# Patient Record
Sex: Female | Born: 1975 | Race: White | Hispanic: No | Marital: Single | Smoking: Never smoker
Health system: Southern US, Community
[De-identification: ages and names within clinical notes are randomized; demographics above are authoritative.]

## PROBLEM LIST (undated history)

## (undated) HISTORY — PX: AUGMENTATION MAMMAPLASTY: SUR837

## (undated) HISTORY — PX: BREAST EXCISIONAL BIOPSY: SUR124

## (undated) HISTORY — PX: BREAST CYST EXCISION: SHX579

---

## 2012-04-23 ENCOUNTER — Other Ambulatory Visit: Payer: Self-pay | Admitting: Infectious Diseases

## 2012-04-23 ENCOUNTER — Ambulatory Visit
Admission: RE | Admit: 2012-04-23 | Discharge: 2012-04-23 | Disposition: A | Discharge status: Home / Self Care | Payer: 59 | Source: Ambulatory Visit | Attending: Infectious Diseases | Admitting: Infectious Diseases

## 2012-04-23 DIAGNOSIS — M79673 Pain in unspecified foot: Secondary | ICD-10-CM

## 2013-03-25 ENCOUNTER — Other Ambulatory Visit: Payer: Self-pay | Admitting: Obstetrics and Gynecology

## 2013-03-25 DIAGNOSIS — Z803 Family history of malignant neoplasm of breast: Secondary | ICD-10-CM

## 2013-04-02 ENCOUNTER — Ambulatory Visit (HOSPITAL_COMMUNITY): Admission: RE | Admit: 2013-04-02 | Payer: 59 | Source: Ambulatory Visit

## 2013-04-02 ENCOUNTER — Ambulatory Visit (HOSPITAL_COMMUNITY)
Admission: RE | Admit: 2013-04-02 | Discharge: 2013-04-02 | Disposition: A | Discharge status: Home / Self Care | Payer: 59 | Source: Ambulatory Visit | Attending: Obstetrics and Gynecology | Admitting: Obstetrics and Gynecology

## 2013-04-02 DIAGNOSIS — Z1239 Encounter for other screening for malignant neoplasm of breast: Secondary | ICD-10-CM | POA: Insufficient documentation

## 2013-04-02 DIAGNOSIS — Z803 Family history of malignant neoplasm of breast: Secondary | ICD-10-CM | POA: Insufficient documentation

## 2013-04-02 MED ORDER — GADOBENATE DIMEGLUMINE 529 MG/ML IV SOLN
15.0000 mL | Freq: Once | INTRAVENOUS | Status: AC | PRN
  Administered 2013-04-02: 15 mL via INTRAVENOUS

## 2014-05-10 ENCOUNTER — Other Ambulatory Visit: Payer: Self-pay | Admitting: Obstetrics and Gynecology

## 2014-05-10 DIAGNOSIS — R928 Other abnormal and inconclusive findings on diagnostic imaging of breast: Secondary | ICD-10-CM

## 2014-05-20 ENCOUNTER — Encounter (INDEPENDENT_AMBULATORY_CARE_PROVIDER_SITE_OTHER): Payer: Self-pay

## 2014-05-20 ENCOUNTER — Ambulatory Visit
Admission: RE | Admit: 2014-05-20 | Discharge: 2014-05-20 | Disposition: A | Discharge status: Home / Self Care | Payer: 59 | Source: Ambulatory Visit | Attending: Obstetrics and Gynecology | Admitting: Obstetrics and Gynecology

## 2014-05-20 DIAGNOSIS — R928 Other abnormal and inconclusive findings on diagnostic imaging of breast: Secondary | ICD-10-CM

## 2014-05-26 ENCOUNTER — Other Ambulatory Visit: Payer: Self-pay | Admitting: Obstetrics and Gynecology

## 2014-05-26 DIAGNOSIS — R922 Inconclusive mammogram: Secondary | ICD-10-CM

## 2014-06-15 ENCOUNTER — Ambulatory Visit
Admission: RE | Admit: 2014-06-15 | Discharge: 2014-06-15 | Disposition: A | Discharge status: Home / Self Care | Payer: 59 | Source: Ambulatory Visit | Attending: Obstetrics and Gynecology | Admitting: Obstetrics and Gynecology

## 2014-06-15 DIAGNOSIS — R922 Inconclusive mammogram: Secondary | ICD-10-CM

## 2014-06-15 MED ORDER — GADOBENATE DIMEGLUMINE 529 MG/ML IV SOLN
15.0000 mL | Freq: Once | INTRAVENOUS | Status: AC | PRN
  Administered 2014-06-15: 15 mL via INTRAVENOUS

## 2015-04-02 ENCOUNTER — Emergency Department (HOSPITAL_COMMUNITY)
Admission: EM | Admit: 2015-04-02 | Discharge: 2015-04-02 | Disposition: A | Discharge status: Home / Self Care | Payer: 59 | Source: Home / Self Care | Attending: Family Medicine | Admitting: Family Medicine

## 2015-04-02 ENCOUNTER — Encounter (HOSPITAL_COMMUNITY): Payer: Self-pay | Admitting: Emergency Medicine

## 2015-04-02 DIAGNOSIS — H6981 Other specified disorders of Eustachian tube, right ear: Secondary | ICD-10-CM | POA: Diagnosis not present

## 2015-04-02 MED ORDER — IPRATROPIUM BROMIDE 0.06 % NA SOLN: 2.0000 | Freq: Four times a day (QID) | NASAL | Status: AC

## 2015-04-02 NOTE — ED Notes (Signed)
Right ear pain for one week.  Initially pain radiated to jaw, now that has stopped and pain is radiating down neck.

## 2015-04-02 NOTE — ED Provider Notes (Signed)
CSN: [id number]     Arrival date & time 04/02/15  [id number] History   First MD Initiated Contact with Patient 04/02/15 1129     Chief Complaint  Patient presents with  . Otalgia   (Consider location/radiation/quality/duration/timing/severity/associated sxs/prior Treatment) Patient is a 39 y.o. female presenting with URI. The history is provided by the patient.  URI Presenting symptoms: congestion, ear pain and rhinorrhea   Presenting symptoms: no cough, no fever and no sore throat   Severity:  Mild Onset quality:  Gradual Duration:  1 week Chronicity:  New Relieved by:  None tried Worsened by:  Nothing tried Associated symptoms: no sinus pain and no wheezing     History reviewed. No pertinent past medical history. History reviewed. No pertinent past surgical history. No family history on file. History  Substance Use Topics  . Smoking status: Never Smoker   . Smokeless tobacco: Not on file  . Alcohol Use: Yes   OB History    No data available     Review of Systems  Constitutional: Negative.  Negative for fever.  HENT: Positive for congestion, ear pain, postnasal drip and rhinorrhea. Negative for sore throat.   Respiratory: Negative for cough, shortness of breath and wheezing.   Cardiovascular: Negative.     Allergies  Penicillins and Sulfa antibiotics  Home Medications   Prior to Admission medications   Medication Sig Start Date End Date Taking? Authorizing Provider  cetirizine (ZYRTEC) 10 MG tablet Take 10 mg by mouth daily.   Yes Historical Provider, MD  ipratropium (ATROVENT) 0.06 % nasal spray Place 2 sprays into both nostrils 4 (four) times daily. 04/02/15   Linna HoffJames D Kindl, MD   BP 122/73 mmHg  Pulse 72  Temp(Src) 99.1 F (37.3 C) (Oral)  Resp 16  SpO2 100%  LMP 03/09/2015 Physical Exam  Constitutional: She is oriented to person, place, and time. She appears well-developed and well-nourished. No distress.  HENT:  Head: Normocephalic.  Right Ear: External ear  normal.  Left Ear: External ear normal.  Mouth/Throat: Oropharynx is clear and moist.  No tmj pain no dental pain.  Eyes: Conjunctivae are normal. Pupils are equal, round, and reactive to light.  Neck: Normal range of motion. Neck supple.  Lymphadenopathy:    She has no cervical adenopathy.  Neurological: She is alert and oriented to person, place, and time.  Skin: Skin is warm and dry.  Nursing note and vitals reviewed.   ED Course  Procedures (including critical care time) Labs Review Labs Reviewed - No data to display  Imaging Review No results found.   MDM   1. ETD (eustachian tube dysfunction), right        Linna HoffJames D Kindl, MD 04/02/15 1144

## 2016-02-10 IMAGING — US US BREAST*R* LIMITED INC AXILLA
1 series · 5 of 5 positions shown · non-contrast
Comparison: 05/04/2014 and earlier

CLINICAL DATA: The patient returns for further evaluation of the
right breast.

EXAM:
DIGITAL DIAGNOSTIC  RIGHT MAMMOGRAM
ULTRASOUND RIGHT BREAST

[Series 1: us breast*right* limited inc axilla · 5 of 5 slices shown]
[im 1/5]
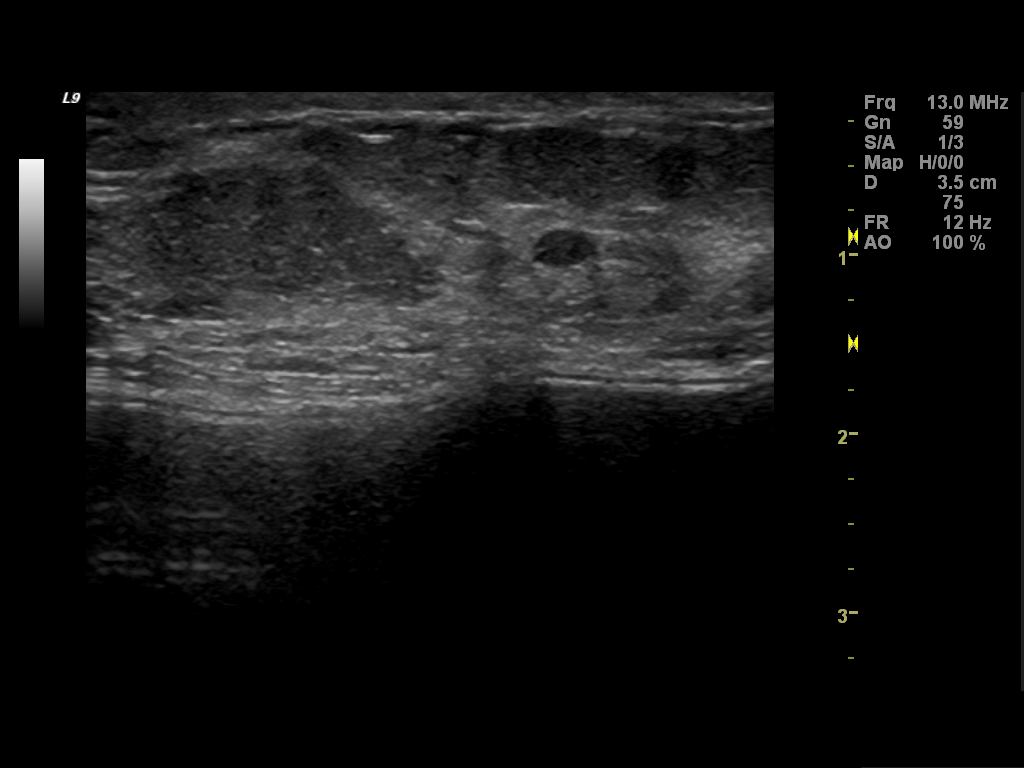
[im 2/5]
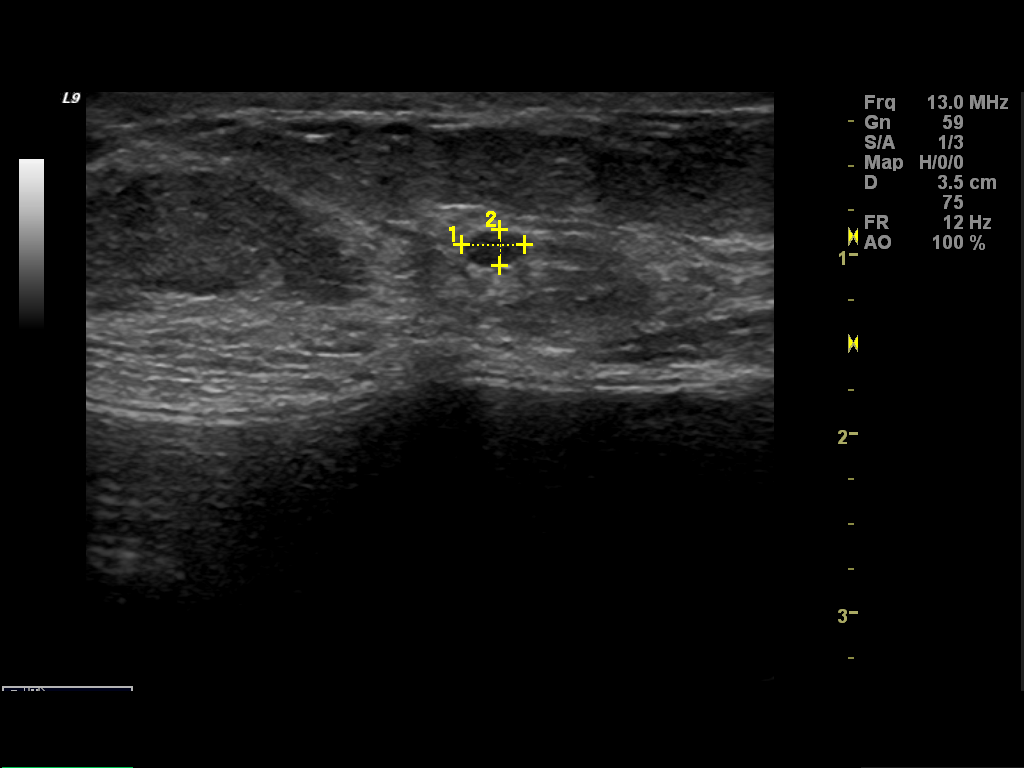
[im 3/5]
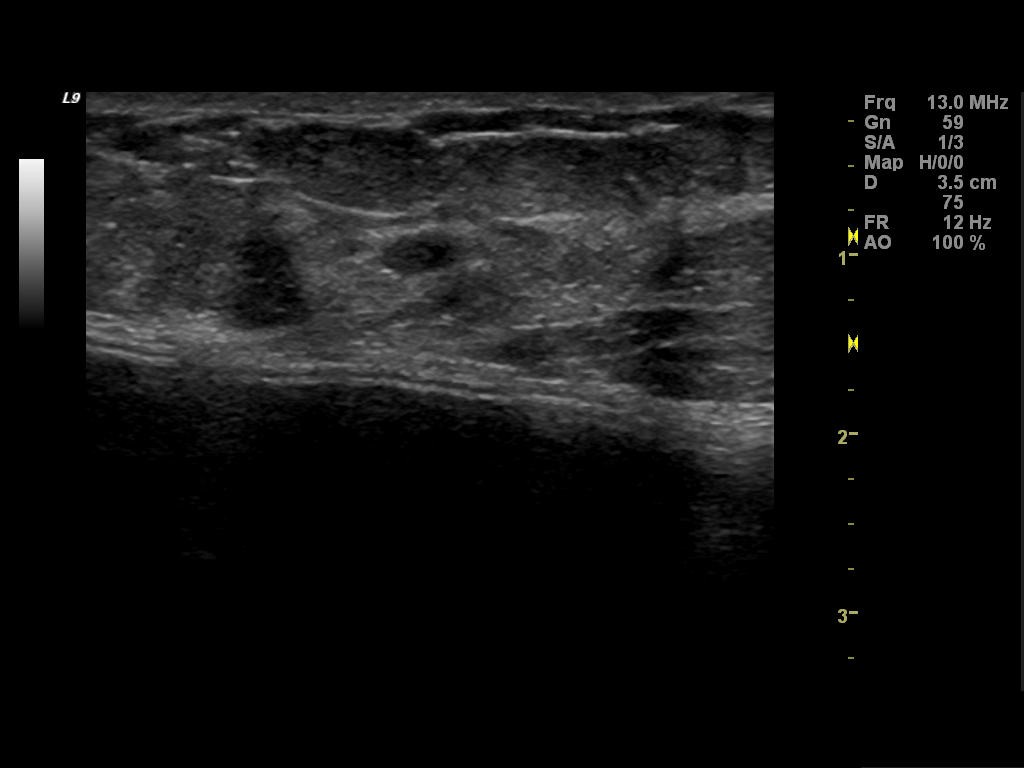
[im 4/5]
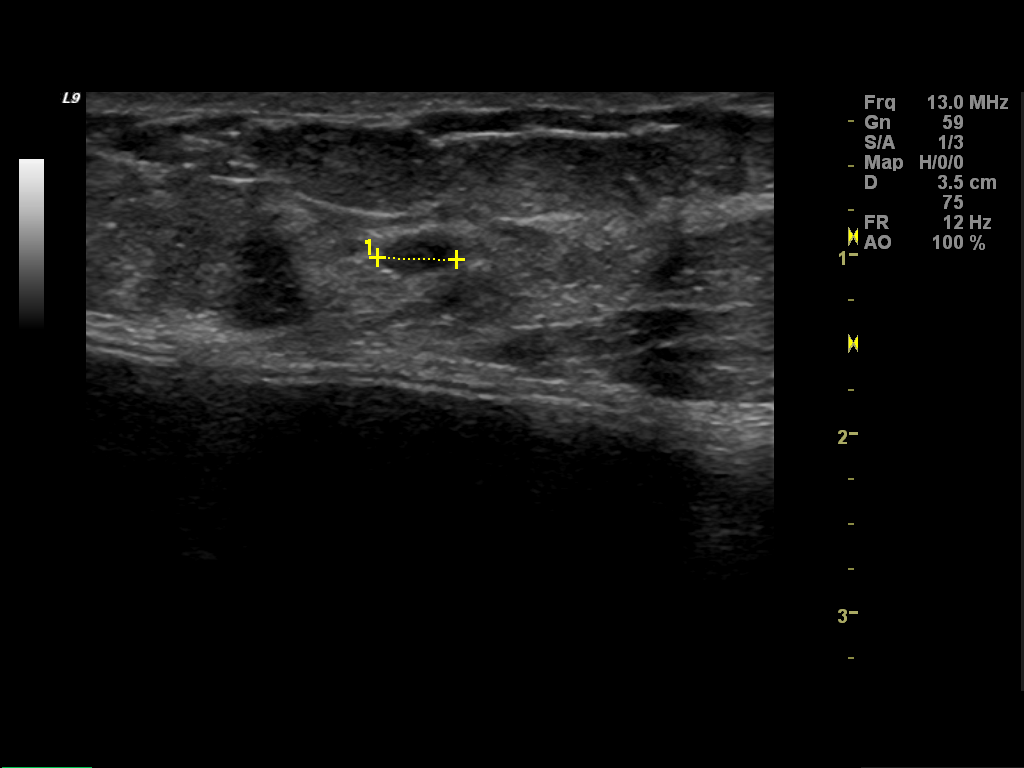
[im 5/5]
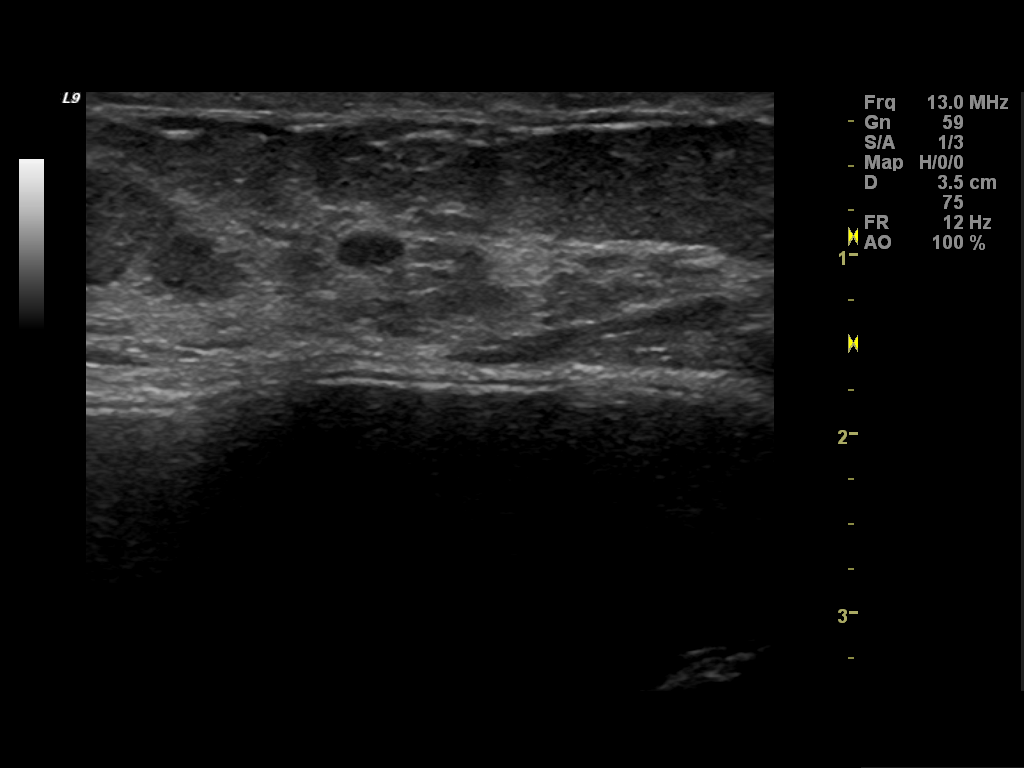

[5 of 5 positions shown; findings below may reference images not displayed]

ACR Breast Density Category c: The breast tissue is heterogeneously
dense, which may obscure small masses.
FINDINGS: The patient has bilateral retropectoral saline implants. Additional
views are performed showing no persistent abnormality in the central
aspect of the right breast.

On physical exam, I palpate no abnormality in the upper, mid, or
lower central portions of the right breast.

Ultrasound is performed, showing a simple cyst in the 4 o'clock
location of the right breast 3 cm from nipple, felt to be an
incidental finding. Cyst measures 4 mm in maximum diameter. No solid
or cystic mass identified in the central aspect of the breast.
IMPRESSION: 1. No suspicious abnormality in the right breast. Incidental note of
benign right breast cyst.
2.  No mammographic or ultrasound evidence for malignancy.

RECOMMENDATION:
Screening mammogram in one year.(Code:E6-3-V1B)

I have discussed the findings and recommendations with the patient.
Results were also provided in writing at the conclusion of the
visit. If applicable, a reminder letter will be sent to the patient
regarding the next appointment.

BI-RADS CATEGORY  2: Benign.

## 2017-02-18 DIAGNOSIS — N926 Irregular menstruation, unspecified: Secondary | ICD-10-CM | POA: Diagnosis not present

## 2017-03-14 DIAGNOSIS — N921 Excessive and frequent menstruation with irregular cycle: Secondary | ICD-10-CM | POA: Diagnosis not present

## 2017-03-14 DIAGNOSIS — N924 Excessive bleeding in the premenopausal period: Secondary | ICD-10-CM | POA: Diagnosis not present

## 2017-04-17 DIAGNOSIS — N840 Polyp of corpus uteri: Secondary | ICD-10-CM | POA: Diagnosis not present

## 2017-07-15 ENCOUNTER — Other Ambulatory Visit: Payer: Self-pay | Admitting: Family Medicine

## 2017-07-15 DIAGNOSIS — Z1231 Encounter for screening mammogram for malignant neoplasm of breast: Secondary | ICD-10-CM

## 2017-08-01 ENCOUNTER — Ambulatory Visit
Admission: RE | Admit: 2017-08-01 | Discharge: 2017-08-01 | Disposition: A | Discharge status: Home / Self Care | Payer: 59 | Source: Ambulatory Visit | Attending: Family Medicine | Admitting: Family Medicine

## 2017-08-01 DIAGNOSIS — Z1231 Encounter for screening mammogram for malignant neoplasm of breast: Secondary | ICD-10-CM | POA: Diagnosis not present

## 2017-10-21 DIAGNOSIS — Z801 Family history of malignant neoplasm of trachea, bronchus and lung: Secondary | ICD-10-CM | POA: Diagnosis not present

## 2017-10-21 DIAGNOSIS — Z01419 Encounter for gynecological examination (general) (routine) without abnormal findings: Secondary | ICD-10-CM | POA: Diagnosis not present

## 2017-10-21 DIAGNOSIS — Z803 Family history of malignant neoplasm of breast: Secondary | ICD-10-CM | POA: Diagnosis not present

## 2017-10-31 DIAGNOSIS — R030 Elevated blood-pressure reading, without diagnosis of hypertension: Secondary | ICD-10-CM | POA: Diagnosis not present

## 2017-10-31 DIAGNOSIS — Z0000 Encounter for general adult medical examination without abnormal findings: Secondary | ICD-10-CM | POA: Diagnosis not present

## 2017-12-10 DIAGNOSIS — Z809 Family history of malignant neoplasm, unspecified: Secondary | ICD-10-CM | POA: Diagnosis not present

## 2018-07-22 ENCOUNTER — Other Ambulatory Visit: Payer: Self-pay | Admitting: Family Medicine

## 2018-07-22 DIAGNOSIS — Z1231 Encounter for screening mammogram for malignant neoplasm of breast: Secondary | ICD-10-CM

## 2018-08-21 ENCOUNTER — Ambulatory Visit
Admission: RE | Admit: 2018-08-21 | Discharge: 2018-08-21 | Disposition: A | Discharge status: Home / Self Care | Payer: 59 | Source: Ambulatory Visit | Attending: Family Medicine | Admitting: Family Medicine

## 2018-08-21 DIAGNOSIS — Z1231 Encounter for screening mammogram for malignant neoplasm of breast: Secondary | ICD-10-CM | POA: Diagnosis not present

## 2018-11-23 DIAGNOSIS — G43909 Migraine, unspecified, not intractable, without status migrainosus: Secondary | ICD-10-CM | POA: Diagnosis not present

## 2018-11-27 DIAGNOSIS — G43909 Migraine, unspecified, not intractable, without status migrainosus: Secondary | ICD-10-CM | POA: Diagnosis not present

## 2018-11-27 DIAGNOSIS — R739 Hyperglycemia, unspecified: Secondary | ICD-10-CM | POA: Diagnosis not present

## 2018-11-27 DIAGNOSIS — Z0000 Encounter for general adult medical examination without abnormal findings: Secondary | ICD-10-CM | POA: Diagnosis not present

## 2018-11-27 DIAGNOSIS — Z13220 Encounter for screening for lipoid disorders: Secondary | ICD-10-CM | POA: Diagnosis not present

## 2018-12-09 ENCOUNTER — Other Ambulatory Visit: Payer: Self-pay | Admitting: Obstetrics and Gynecology

## 2018-12-09 DIAGNOSIS — Z803 Family history of malignant neoplasm of breast: Secondary | ICD-10-CM

## 2018-12-22 ENCOUNTER — Other Ambulatory Visit (HOSPITAL_COMMUNITY): Payer: Self-pay | Admitting: Obstetrics and Gynecology

## 2018-12-22 DIAGNOSIS — Z803 Family history of malignant neoplasm of breast: Secondary | ICD-10-CM

## 2019-07-12 ENCOUNTER — Other Ambulatory Visit: Payer: Self-pay | Admitting: Family Medicine

## 2019-07-12 DIAGNOSIS — Z1231 Encounter for screening mammogram for malignant neoplasm of breast: Secondary | ICD-10-CM

## 2019-08-31 ENCOUNTER — Ambulatory Visit: Payer: 59

## 2019-10-12 ENCOUNTER — Ambulatory Visit
Admission: RE | Admit: 2019-10-12 | Discharge: 2019-10-12 | Disposition: A | Discharge status: Home / Self Care | Payer: Managed Care, Other (non HMO) | Source: Ambulatory Visit | Attending: Family Medicine | Admitting: Family Medicine

## 2019-10-12 ENCOUNTER — Other Ambulatory Visit: Payer: Self-pay

## 2019-10-12 DIAGNOSIS — Z1231 Encounter for screening mammogram for malignant neoplasm of breast: Secondary | ICD-10-CM

## 2020-10-13 ENCOUNTER — Other Ambulatory Visit: Payer: Self-pay | Admitting: Obstetrics and Gynecology

## 2020-10-13 DIAGNOSIS — Z1231 Encounter for screening mammogram for malignant neoplasm of breast: Secondary | ICD-10-CM

## 2020-10-25 ENCOUNTER — Other Ambulatory Visit: Payer: Self-pay

## 2020-10-25 ENCOUNTER — Ambulatory Visit
Admission: RE | Admit: 2020-10-25 | Discharge: 2020-10-25 | Disposition: A | Discharge status: Home / Self Care | Payer: Managed Care, Other (non HMO) | Source: Ambulatory Visit

## 2020-10-25 ENCOUNTER — Other Ambulatory Visit: Payer: Self-pay | Admitting: Obstetrics and Gynecology

## 2020-10-25 DIAGNOSIS — Z1231 Encounter for screening mammogram for malignant neoplasm of breast: Secondary | ICD-10-CM

## 2020-11-27 ENCOUNTER — Other Ambulatory Visit: Payer: Self-pay | Admitting: Family Medicine

## 2020-11-27 DIAGNOSIS — E049 Nontoxic goiter, unspecified: Secondary | ICD-10-CM

## 2020-12-14 ENCOUNTER — Ambulatory Visit
Admission: RE | Admit: 2020-12-14 | Discharge: 2020-12-14 | Disposition: A | Discharge status: Home / Self Care | Payer: Managed Care, Other (non HMO) | Source: Ambulatory Visit | Attending: Family Medicine | Admitting: Family Medicine

## 2020-12-14 DIAGNOSIS — E049 Nontoxic goiter, unspecified: Secondary | ICD-10-CM

## 2021-03-15 ENCOUNTER — Encounter: Payer: Self-pay | Admitting: Endocrinology

## 2021-03-15 ENCOUNTER — Other Ambulatory Visit: Payer: Self-pay

## 2021-03-15 ENCOUNTER — Ambulatory Visit (INDEPENDENT_AMBULATORY_CARE_PROVIDER_SITE_OTHER): Payer: 59 | Admitting: Endocrinology

## 2021-03-15 DIAGNOSIS — E042 Nontoxic multinodular goiter: Secondary | ICD-10-CM | POA: Diagnosis not present

## 2021-03-15 LAB — TSH: TSH: 1.66 u[IU]/mL (ref 0.35–4.50)

## 2021-03-15 LAB — T4, FREE: Free T4: 0.83 ng/dL (ref 0.60–1.60)

## 2021-03-15 LAB — T3, FREE: T3, Free: 3.9 pg/mL (ref 2.3–4.2)

## 2021-03-15 NOTE — Progress Notes (Signed)
Subjective:    Patient ID: Tonya Crosby, female    DOB: 26-Feb-1976, 45 y.o.   MRN: [id number]  HPI Pt is referred by Dr Hyman Hopes, for nodular thyroid.  Pt says her dentist noted a goiter in late 2021.  she is unaware of ever having had thyroid problems in the past.  she has no h/o XRT or surgery to the neck.  She says she does not take any supplements such as biotin.  No past medical history on file.  Past Surgical History:  Procedure Laterality Date  . AUGMENTATION MAMMAPLASTY Bilateral   . BREAST CYST EXCISION Left   . BREAST EXCISIONAL BIOPSY Left     Social History   Socioeconomic History  . Marital status: Single    Spouse name: Not on file  . Number of children: Not on file  . Years of education: Not on file  . Highest education level: Not on file  Occupational History  . Not on file  Tobacco Use  . Smoking status: Never Smoker  . Smokeless tobacco: Not on file  Substance and Sexual Activity  . Alcohol use: Yes  . Drug use: No  . Sexual activity: Not on file  Other Topics Concern  . Not on file  Social History Narrative  . Not on file   Social Determinants of Health   Financial Resource Strain: Not on file  Food Insecurity: Not on file  Transportation Needs: Not on file  Physical Activity: Not on file  Stress: Not on file  Social Connections: Not on file  Intimate Partner Violence: Not on file    Current Outpatient Medications on File Prior to Visit  Medication Sig Dispense Refill  . cetirizine (ZYRTEC) 10 MG tablet Take 10 mg by mouth daily.    Marland Kitchen ipratropium (ATROVENT) 0.06 % nasal spray Place 2 sprays into both nostrils 4 (four) times daily. 15 mL 1   No current facility-administered medications on file prior to visit.    Allergies  Allergen Reactions  . Penicillins   . Sulfa Antibiotics     Family History  Problem Relation Age of Onset  . Breast cancer Mother 30  . Thyroid disease Mother     BP 136/84 (BP Location: Right Arm, Patient  Position: Sitting, Cuff Size: Large)   Pulse 95   Ht 5\' 8"  (1.727 m)   Wt 214 lb 6.4 oz (97.3 kg)   SpO2 95%   BMI 32.60 kg/m    Review of Systems Denies hoarseness, neck pain, diarrhea, flushing. She has weight gain.      Objective:   Physical Exam VITAL SIGNS:  See vs page GENERAL: no distress NECK: There is no palpable thyroid enlargement.  No thyroid nodule is palpable.  No palpable lymphadenopathy at the anterior neck.      : No discrete nodules of consequence are seen within the thyroid gland. A tiny subcentimeter nodule is identified in the right mid gland.   I have reviewed outside records, and summarized: Pt was noted to have elevated A1c, and referred here.  Wellness was also addressed, and general health was good.   Lab Results  Component Value Date   TSH 1.66 03/15/2021      Assessment & Plan:  Small MNG, new to me.  Low risk of malignancy Euthyroid: no thyroid medication is needed.  Patient Instructions  for the nodules, no follow up is needed except for having Dr 03/17/2021 examine your neck each year.   Blood tests are  requested for you today.  We'll let you know about the results.   I would be happy to see you back here as needed.

## 2021-03-15 NOTE — Patient Instructions (Addendum)
for the nodules, no follow up is needed except for having Dr Hyman Hopes examine your neck each year.   Blood tests are requested for you today.  We'll let you know about the results.   I would be happy to see you back here as needed.

## 2021-03-20 LAB — CALCITONIN: Calcitonin: 2 pg/mL (ref <=5)

## 2021-10-02 ENCOUNTER — Other Ambulatory Visit: Payer: Self-pay | Admitting: Obstetrics and Gynecology

## 2021-10-02 DIAGNOSIS — Z1231 Encounter for screening mammogram for malignant neoplasm of breast: Secondary | ICD-10-CM

## 2021-11-02 ENCOUNTER — Other Ambulatory Visit: Payer: Self-pay

## 2021-11-02 ENCOUNTER — Ambulatory Visit
Admission: RE | Admit: 2021-11-02 | Discharge: 2021-11-02 | Disposition: A | Discharge status: Home / Self Care | Payer: 59 | Source: Ambulatory Visit

## 2021-11-02 DIAGNOSIS — Z1231 Encounter for screening mammogram for malignant neoplasm of breast: Secondary | ICD-10-CM

## 2022-09-19 ENCOUNTER — Other Ambulatory Visit: Payer: Self-pay | Admitting: Obstetrics and Gynecology

## 2022-09-19 DIAGNOSIS — Z1231 Encounter for screening mammogram for malignant neoplasm of breast: Secondary | ICD-10-CM

## 2022-11-07 ENCOUNTER — Ambulatory Visit
Admission: RE | Admit: 2022-11-07 | Discharge: 2022-11-07 | Disposition: A | Discharge status: Home / Self Care | Payer: Managed Care, Other (non HMO) | Source: Ambulatory Visit | Attending: Obstetrics and Gynecology | Admitting: Obstetrics and Gynecology

## 2022-11-07 DIAGNOSIS — Z1231 Encounter for screening mammogram for malignant neoplasm of breast: Secondary | ICD-10-CM

## 2023-07-26 IMAGING — MG DIGITAL SCREENING BREAST BILAT IMPLANT W/ TOMO W/ CAD
8 of 12 series · 8 of 28 positions shown · non-contrast
Comparison: Previous exam(s).

CLINICAL DATA: Screening.

EXAM:
DIGITAL SCREENING BILATERAL MAMMOGRAM WITH IMPLANTS, CAD AND
TOMOSYNTHESIS
TECHNIQUE: Bilateral screening digital craniocaudal and mediolateral oblique
mammograms were obtained. Bilateral screening digital breast
tomosynthesis was performed. The images were evaluated with
computer-aided detection. Standard and/or implant displaced views
were performed.

[L CC]
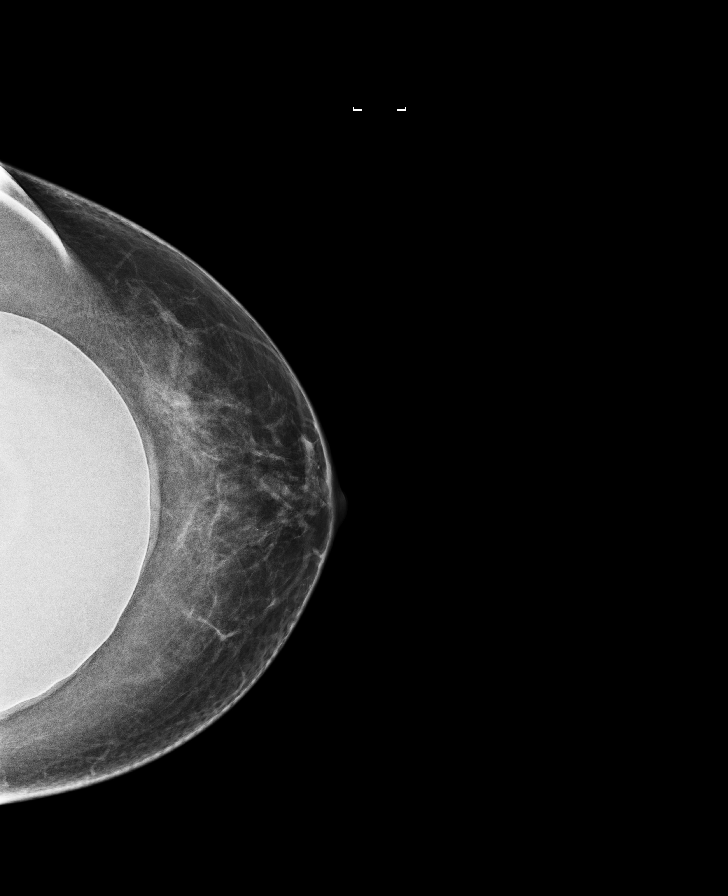

[R MLO]
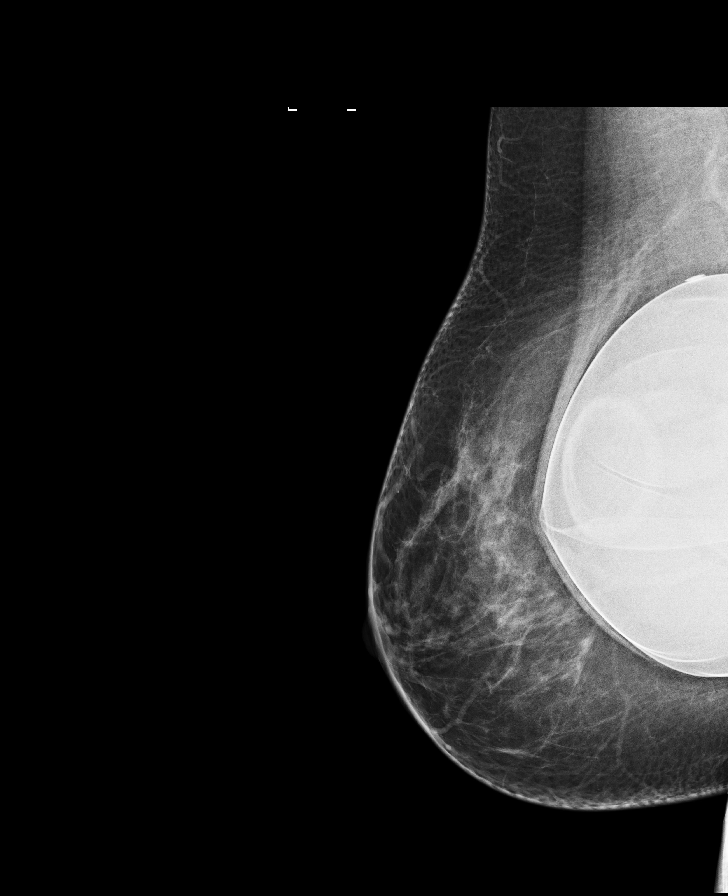

[L MLO]
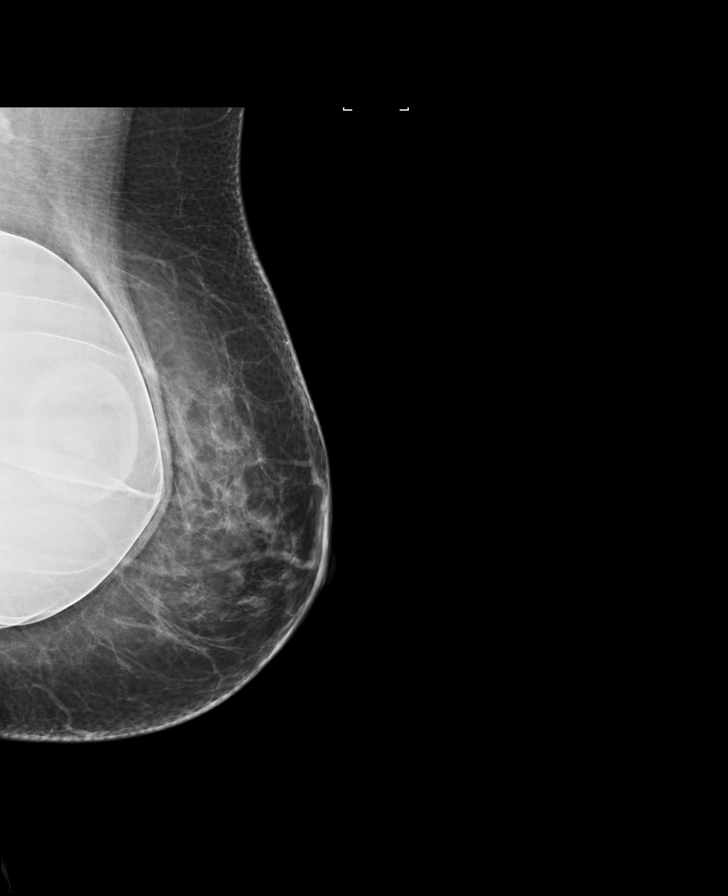

[R CC]
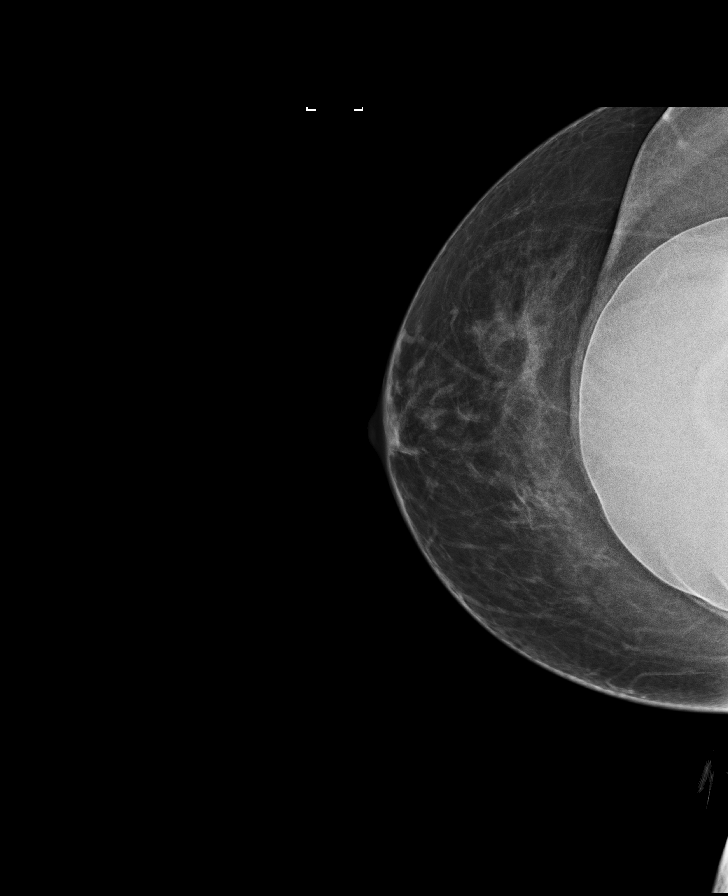

[L CC synth-2D]
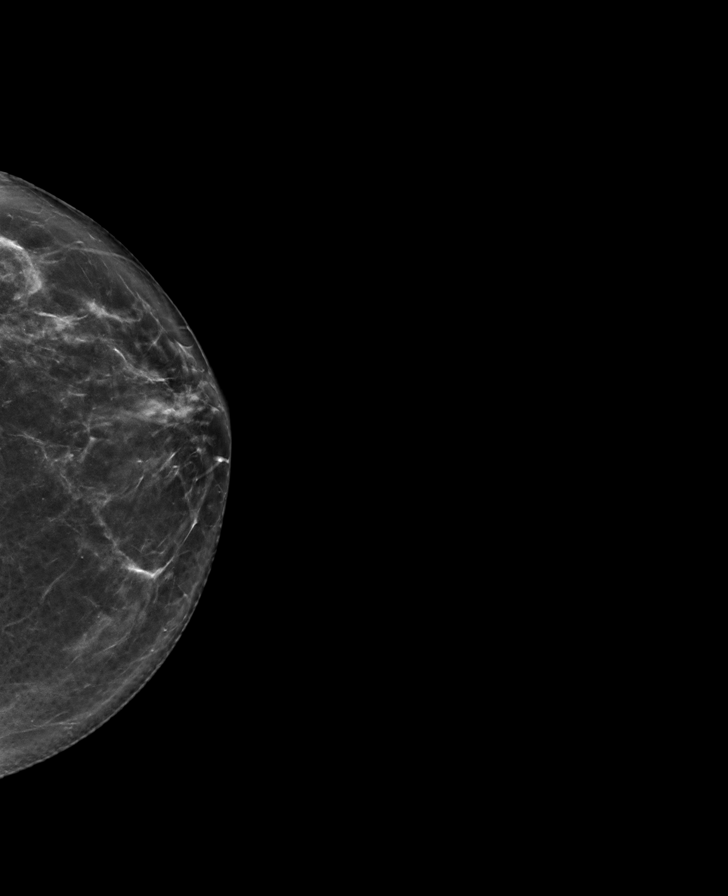

[R MLO synth-2D]
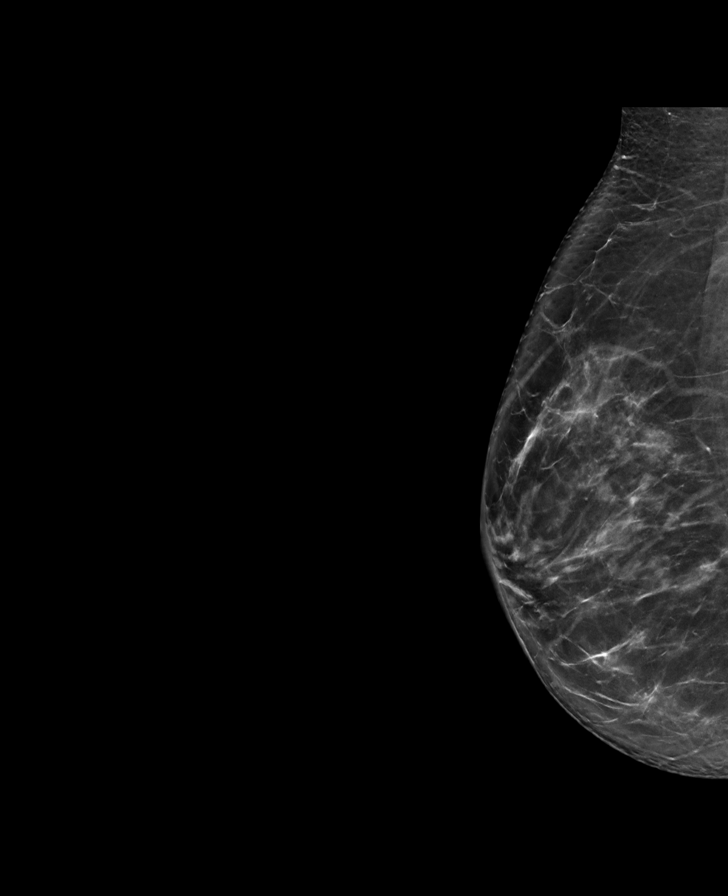

[R CC synth-2D]
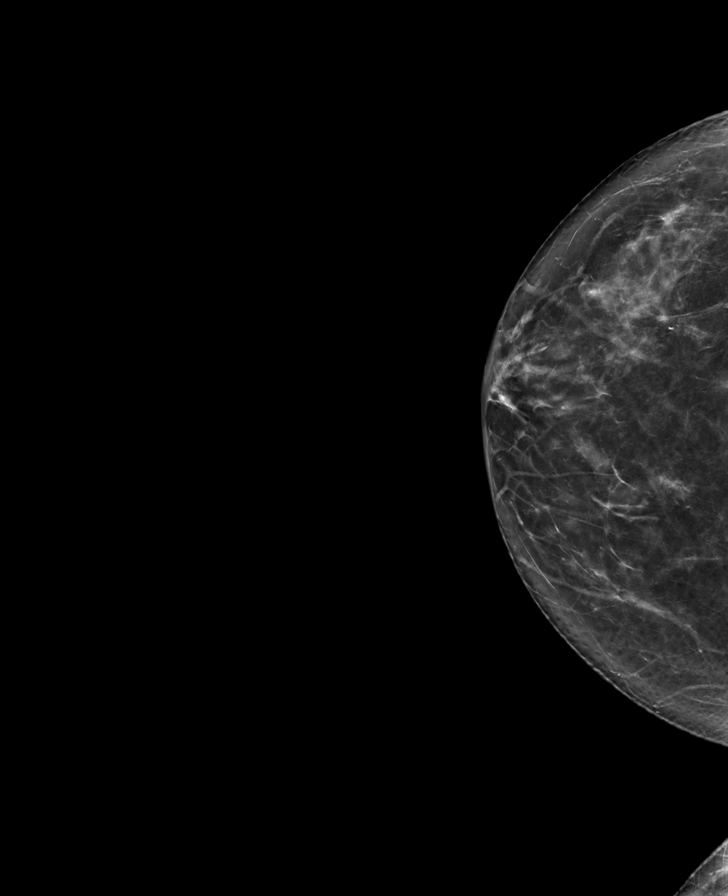

[L MLO synth-2D]
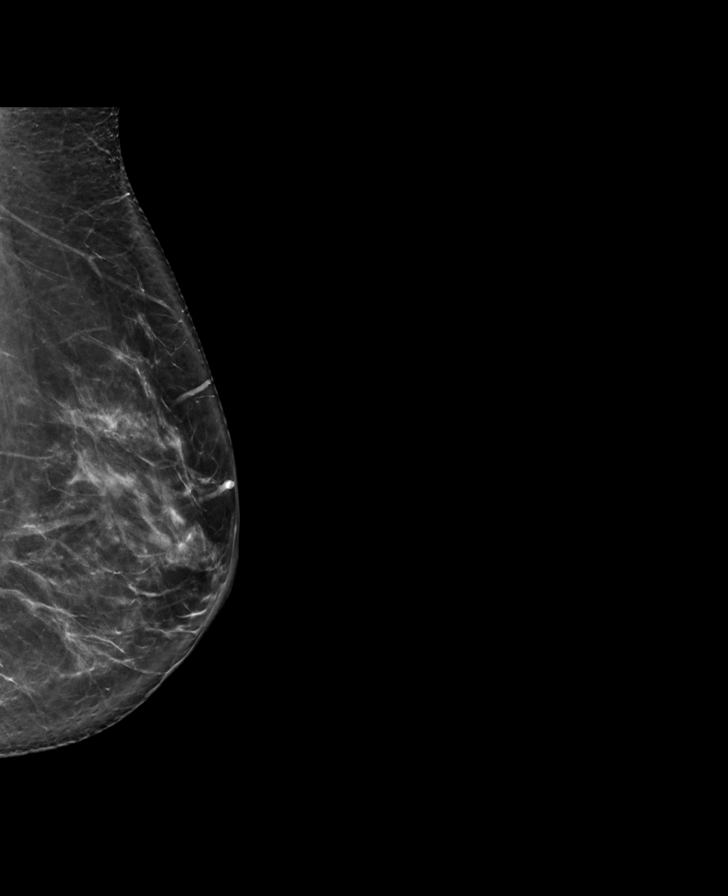

[8 of 28 positions shown; findings below may reference images not displayed]

ACR Breast Density Category b: There are scattered areas of
fibroglandular density.
FINDINGS: The patient has retropectoral implants. Stable postsurgical scarring
in the upper left breast. There are no findings suspicious for
malignancy.
IMPRESSION: No mammographic evidence of malignancy. A result letter of this
screening mammogram will be mailed directly to the patient.

RECOMMENDATION:
Screening mammogram in one year. (Code:PV-1-J4B)

BI-RADS CATEGORY  2: Benign.

## 2023-09-26 ENCOUNTER — Other Ambulatory Visit: Payer: Self-pay | Admitting: Obstetrics and Gynecology

## 2023-09-26 DIAGNOSIS — Z1231 Encounter for screening mammogram for malignant neoplasm of breast: Secondary | ICD-10-CM

## 2023-11-14 ENCOUNTER — Other Ambulatory Visit: Payer: Self-pay | Admitting: Obstetrics and Gynecology

## 2023-11-14 ENCOUNTER — Ambulatory Visit
Admission: RE | Admit: 2023-11-14 | Discharge: 2023-11-14 | Disposition: A | Discharge status: Home / Self Care | Payer: Managed Care, Other (non HMO) | Source: Ambulatory Visit | Attending: Obstetrics and Gynecology | Admitting: Obstetrics and Gynecology

## 2023-11-14 DIAGNOSIS — Z1231 Encounter for screening mammogram for malignant neoplasm of breast: Secondary | ICD-10-CM

## 2023-11-19 ENCOUNTER — Other Ambulatory Visit: Payer: Self-pay | Admitting: Obstetrics and Gynecology

## 2023-11-19 DIAGNOSIS — R928 Other abnormal and inconclusive findings on diagnostic imaging of breast: Secondary | ICD-10-CM

## 2023-12-11 ENCOUNTER — Ambulatory Visit
Admission: RE | Admit: 2023-12-11 | Discharge: 2023-12-11 | Disposition: A | Discharge status: Home / Self Care | Payer: Managed Care, Other (non HMO) | Source: Ambulatory Visit | Attending: Obstetrics and Gynecology | Admitting: Obstetrics and Gynecology

## 2023-12-11 DIAGNOSIS — R928 Other abnormal and inconclusive findings on diagnostic imaging of breast: Secondary | ICD-10-CM

## 2024-02-10 ENCOUNTER — Other Ambulatory Visit: Payer: Self-pay | Admitting: Obstetrics and Gynecology

## 2024-02-10 DIAGNOSIS — Z9189 Other specified personal risk factors, not elsewhere classified: Secondary | ICD-10-CM

## 2024-03-13 ENCOUNTER — Ambulatory Visit
Admission: RE | Admit: 2024-03-13 | Discharge: 2024-03-13 | Disposition: A | Discharge status: Home / Self Care | Source: Ambulatory Visit | Attending: Obstetrics and Gynecology | Admitting: Obstetrics and Gynecology

## 2024-03-13 DIAGNOSIS — Z9189 Other specified personal risk factors, not elsewhere classified: Secondary | ICD-10-CM

## 2024-03-13 MED ORDER — GADOPICLENOL 0.5 MMOL/ML IV SOLN
9.0000 mL | Freq: Once | INTRAVENOUS | Status: AC | PRN
  Administered 2024-03-13: 9 mL via INTRAVENOUS

## 2024-10-04 ENCOUNTER — Other Ambulatory Visit: Payer: Self-pay | Admitting: Obstetrics and Gynecology

## 2024-10-04 DIAGNOSIS — Z9189 Other specified personal risk factors, not elsewhere classified: Secondary | ICD-10-CM

## 2024-10-13 ENCOUNTER — Other Ambulatory Visit: Payer: Self-pay | Admitting: Obstetrics and Gynecology

## 2024-10-13 DIAGNOSIS — Z1231 Encounter for screening mammogram for malignant neoplasm of breast: Secondary | ICD-10-CM

## 2024-12-16 ENCOUNTER — Other Ambulatory Visit: Payer: Self-pay | Admitting: Obstetrics and Gynecology

## 2024-12-16 ENCOUNTER — Ambulatory Visit
Admission: RE | Admit: 2024-12-16 | Discharge: 2024-12-16 | Disposition: A | Source: Ambulatory Visit | Attending: Obstetrics and Gynecology | Admitting: Obstetrics and Gynecology

## 2024-12-16 DIAGNOSIS — Z1231 Encounter for screening mammogram for malignant neoplasm of breast: Secondary | ICD-10-CM
